# Patient Record
Sex: Male | Born: 1995 | Race: White | Hispanic: No | Marital: Single | Smoking: Current every day smoker
Health system: Southern US, Community
[De-identification: ages and names within clinical notes are randomized; demographics above are authoritative.]

## PROBLEM LIST (undated history)

## (undated) DIAGNOSIS — S40819A Abrasion of unspecified upper arm, initial encounter: Secondary | ICD-10-CM

## (undated) DIAGNOSIS — Z8719 Personal history of other diseases of the digestive system: Secondary | ICD-10-CM

## (undated) DIAGNOSIS — R05 Cough: Secondary | ICD-10-CM

## (undated) DIAGNOSIS — Q793 Gastroschisis: Secondary | ICD-10-CM

## (undated) DIAGNOSIS — Z8619 Personal history of other infectious and parasitic diseases: Secondary | ICD-10-CM

## (undated) DIAGNOSIS — S91329A Laceration with foreign body, unspecified foot, initial encounter: Secondary | ICD-10-CM

## (undated) HISTORY — PX: GASTROSCHISIS CLOSURE: SHX1700

## (undated) HISTORY — PX: INGUINAL HERNIA REPAIR: SHX194

## (undated) HISTORY — PX: ABDOMINAL SURGERY: SHX537

---
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
## 2015-05-01 ENCOUNTER — Emergency Department (HOSPITAL_COMMUNITY)
Admission: EM | Admit: 2015-05-01 | Discharge: 2015-05-01 | Disposition: A | Discharge status: Home / Self Care | Payer: Medicaid Other | Attending: Emergency Medicine | Admitting: Emergency Medicine

## 2015-05-01 ENCOUNTER — Encounter (HOSPITAL_COMMUNITY): Payer: Self-pay | Admitting: *Deleted

## 2015-05-01 ENCOUNTER — Emergency Department (HOSPITAL_COMMUNITY): Payer: Medicaid Other

## 2015-05-01 DIAGNOSIS — F17200 Nicotine dependence, unspecified, uncomplicated: Secondary | ICD-10-CM | POA: Diagnosis not present

## 2015-05-01 DIAGNOSIS — M25461 Effusion, right knee: Secondary | ICD-10-CM | POA: Insufficient documentation

## 2015-05-01 DIAGNOSIS — S59901A Unspecified injury of right elbow, initial encounter: Secondary | ICD-10-CM | POA: Insufficient documentation

## 2015-05-01 DIAGNOSIS — W19XXXA Unspecified fall, initial encounter: Secondary | ICD-10-CM

## 2015-05-01 DIAGNOSIS — W1839XA Other fall on same level, initial encounter: Secondary | ICD-10-CM | POA: Insufficient documentation

## 2015-05-01 DIAGNOSIS — Z8619 Personal history of other infectious and parasitic diseases: Secondary | ICD-10-CM | POA: Diagnosis not present

## 2015-05-01 DIAGNOSIS — Z87738 Personal history of other specified (corrected) congenital malformations of digestive system: Secondary | ICD-10-CM | POA: Diagnosis not present

## 2015-05-01 DIAGNOSIS — Y9289 Other specified places as the place of occurrence of the external cause: Secondary | ICD-10-CM | POA: Diagnosis not present

## 2015-05-01 DIAGNOSIS — S8991XA Unspecified injury of right lower leg, initial encounter: Secondary | ICD-10-CM | POA: Insufficient documentation

## 2015-05-01 DIAGNOSIS — Z23 Encounter for immunization: Secondary | ICD-10-CM | POA: Diagnosis not present

## 2015-05-01 DIAGNOSIS — Y998 Other external cause status: Secondary | ICD-10-CM | POA: Insufficient documentation

## 2015-05-01 DIAGNOSIS — Z8719 Personal history of other diseases of the digestive system: Secondary | ICD-10-CM | POA: Diagnosis not present

## 2015-05-01 DIAGNOSIS — S30811A Abrasion of abdominal wall, initial encounter: Secondary | ICD-10-CM | POA: Diagnosis not present

## 2015-05-01 DIAGNOSIS — Y9301 Activity, walking, marching and hiking: Secondary | ICD-10-CM | POA: Diagnosis not present

## 2015-05-01 DIAGNOSIS — Z79899 Other long term (current) drug therapy: Secondary | ICD-10-CM | POA: Diagnosis not present

## 2015-05-01 DIAGNOSIS — Z791 Long term (current) use of non-steroidal anti-inflammatories (NSAID): Secondary | ICD-10-CM | POA: Insufficient documentation

## 2015-05-01 DIAGNOSIS — R52 Pain, unspecified: Secondary | ICD-10-CM

## 2015-05-01 DIAGNOSIS — Z792 Long term (current) use of antibiotics: Secondary | ICD-10-CM | POA: Insufficient documentation

## 2015-05-01 MED ORDER — MELOXICAM 7.5 MG PO TABS: 7.5000 mg | ORAL_TABLET | Freq: Every day | ORAL | Status: AC | PRN

## 2015-05-01 MED ORDER — TETANUS-DIPHTH-ACELL PERTUSSIS 5-2.5-18.5 LF-MCG/0.5 IM SUSP
0.5000 mL | Freq: Once | INTRAMUSCULAR | Status: AC
  Administered 2015-05-01: 0.5 mL via INTRAMUSCULAR
  Filled 2015-05-01: qty 0.5

## 2015-05-01 NOTE — Discharge Instructions (Signed)
Read the information below.  Use the prescribed medication as directed.  Please discuss all new medications with your pharmacist.  You may return to the Emergency Department at any time for worsening condition or any new symptoms that concern you.  If you develop uncontrolled pain, weakness or numbness of the extremity, severe discoloration of the skin, or you are unable to walk, return to the ER for a recheck.   If you develop redness, swelling, pus draining from the wound, or fevers greater than 100.4, return to the ER immediately for a recheck.     Abrasion An abrasion is a cut or scrape on the outer surface of your skin. An abrasion does not extend through all of the layers of your skin. It is important to care for your abrasion properly to prevent infection. CAUSES Most abrasions are caused by falling on or gliding across the ground or another surface. When your skin rubs on something, the outer and inner layer of skin rubs off.  SYMPTOMS A cut or scrape is the main symptom of this condition. The scrape may be bleeding, or it may appear red or pink. If there was an associated fall, there may be an underlying bruise. DIAGNOSIS An abrasion is diagnosed with a physical exam. TREATMENT Treatment for this condition depends on how large and deep the abrasion is. Usually, your abrasion will be cleaned with water and mild soap. This removes any dirt or debris that may be stuck. An antibiotic ointment may be applied to the abrasion to help prevent infection. A bandage (dressing) may be placed on the abrasion to keep it clean. You may also need a tetanus shot. HOME CARE INSTRUCTIONS Medicines  Take or apply medicines only as directed by your health care provider.  If you were prescribed an antibiotic ointment, finish all of it even if you start to feel better. Wound Care  Clean the wound with mild soap and water 2-3 times per day or as directed by your health care provider. Pat your wound dry with a  clean towel. Do not rub it.  There are many different ways to close and cover a wound. Follow instructions from your health care provider about:  Wound care.  Dressing changes and removal.  Check your wound every day for signs of infection. Watch for:  Redness, swelling, or pain.  Fluid, blood, or pus. General Instructions  Keep the dressing dry as directed by your health care provider. Do not take baths, swim, use a hot tub, or do anything that would put your wound underwater until your health care provider approves.  If there is swelling, raise (elevate) the injured area above the level of your heart while you are sitting or lying down.  Keep all follow-up visits as directed by your health care provider. This is important. SEEK MEDICAL CARE IF:  You received a tetanus shot and you have swelling, severe pain, redness, or bleeding at the injection site.  Your pain is not controlled with medicine.  You have increased redness, swelling, or pain at the site of your wound. SEEK IMMEDIATE MEDICAL CARE IF:  You have a red streak going away from your wound.  You have a fever.  You have fluid, blood, or pus coming from your wound.  You notice a bad smell coming from your wound or your dressing.   This information is not intended to replace advice given to you by your health care provider. Make sure you discuss any questions you have with  your health care provider.   Document Released: 03/07/2005 Document Revised: 02/16/2015 Document Reviewed: 05/26/2014 Elsevier Interactive Patient Education 2016 [employer]  Knee Effusion Knee effusion means that you have extra fluid in your knee. This can cause pain. Your knee may be more difficult to bend and move. HOME CARE  Use crutches as told by your doctor.  Wear a knee brace as told by your doctor.  Apply ice to the swollen area:  Put ice in a plastic bag.  Place a towel between your skin and the bag.  Leave the ice on for  20 minutes, 2-3 times per day.  Keep your knee raised (elevated) when you are sitting or lying down.  Take medicines only as told by your doctor.  Do any rehabilitation or strengthening exercises as told by your doctor.  Rest your knee as told by your doctor. You may start doing your normal activities again when your doctor says it is okay.  Keep all follow-up visits as told by your doctor. This is important. GET HELP IF:   You continue to have pain in your knee. GET HELP RIGHT AWAY IF:  You have increased swelling or redness of your knee.  You have severe pain in your knee.  You have a fever.   This information is not intended to replace advice given to you by your health care provider. Make sure you discuss any questions you have with your health care provider.   Document Released: 06/30/2010 Document Revised: 06/18/2014 Document Reviewed: 01/11/2014 Elsevier Interactive Patient Education [employer]

## 2015-05-01 NOTE — ED Provider Notes (Signed)
CSN: [id number]     Arrival date & time 05/01/15  1434 History  By signing my name below, I, Elon Spanner, attest that this documentation has been prepared under the direction and in the presence of [employer], PA-C. Electronically Signed: Elon Spanner ED Scribe. 05/01/2015. 3:55 PM.    Chief Complaint  Patient presents with  . Knee Pain    right  . Flank Pain    right   The history is provided by the patient. No language interpreter was used.   HPI Comments: Devon Sosa is a 19 y.o. male who presents to the Emergency Department complaining of right knee pain (worse with walking, flexion), left flank pain/abrasions, and right elbow pain onset two days ago.  The patient reports he was walking through an old house and fell through the floor.  He was able to catch himself from falling all the way through with his elbow but his right knee moved medially and he struck his left flank on the boards.  The knee has made a popping sensation intermittently since onset.  He has taken Tylenol but tried no other treatments.  He denies extremity weakness/numbness, blood in stool, hematuria, n/v/d, dysuria.    Past Medical History  Diagnosis Date  . Congenital gastroschisis   . Laceration of foot with foreign body 08/20/2012    left  . History of shingles as a child  . Cough 08/25/2012  . History of fecal impaction     occasionally  . Arm abrasion 08/25/2012    bilateral   Past Surgical History  Procedure Laterality Date  . Abdominal surgery      multiple surgeries after initial gastroschisis closure  . Inguinal hernia repair    . Gastroschisis closure  as a newborn   Family History  Problem Relation Age of Onset  . Adopted: Yes  . Hypertension Mother   . Hypertension Father   . Heart disease Paternal Grandfather     MI   Social History  Substance Use Topics  . Smoking status: Current Every Day Smoker  . Smokeless tobacco: Never Used  . Alcohol Use: No    Review of Systems   Constitutional: Negative for fever.  Cardiovascular: Negative for chest pain.  Gastrointestinal: Negative for vomiting and abdominal pain.  Musculoskeletal: Positive for arthralgias.  Skin: Positive for wound.  Allergic/Immunologic: Negative for immunocompromised state.  Neurological: Negative for weakness and numbness.  Hematological: Does not bruise/bleed easily.  Psychiatric/Behavioral: Negative for self-injury.      Allergies  Epinephrine and Strawberry leaf  Home Medications   Prior to Admission medications   Medication Sig Start Date End Date Taking? Authorizing Provider  albuterol (PROVENTIL HFA;VENTOLIN HFA) 108 (90 BASE) MCG/ACT inhaler Inhale 2 puffs into the lungs every 6 (six) hours as needed for wheezing.    Historical Provider, MD  clindamycin (CLEOCIN) 300 MG capsule Take 1 capsule (300 mg total) by mouth 4 (four) times daily. X 7 days 08/21/12   Richardean Canal, MD  HYDROcodone-acetaminophen (NORCO/VICODIN) 5-325 MG per tablet Take 1 tablet by mouth every 6 (six) hours as needed for pain.    Historical Provider, MD  ibuprofen (ADVIL,MOTRIN) 800 MG tablet Take 1 tablet (800 mg total) by mouth 3 (three) times daily. 08/21/12   Richardean Canal, MD  omeprazole (PRILOSEC) 20 MG capsule Take 20 mg by mouth daily as needed.    Historical Provider, MD   BP 135/86 mmHg  Pulse 63  Temp(Src) 97.8 F (36.6 C) (  Oral)  Resp 18  SpO2 99% Physical Exam  Constitutional: He appears well-developed and well-nourished. No distress.  HENT:  Head: Normocephalic and atraumatic.  Neck: Neck supple.  Pulmonary/Chest: Effort normal.  Abdominal: Soft. He exhibits no distension. There is no tenderness. There is no rebound and no guarding.  No left CVA tenderness.  Musculoskeletal:  Spine nontender, no crepitus, or stepoffs. Lower extremities:  Strength 5/5, sensation intact, distal pulses intact.   Right knee with bony point tenderness medially. No laxity of joint.  Mild pain but able to  perform [facility].  No calf tenderness.  No erythema, edema, or warmth of joint.  Normal gait.  Neurological: He is alert.  Skin: He is not diaphoretic.  Large, healing abrasion to the left lateral abdominal wall.  No erythema, edema, warmth, discharge, or tenderness.   Nursing note and vitals reviewed.   ED Course  Procedures (including critical care time)  DIAGNOSTIC STUDIES: Oxygen Saturation is 99% on RA, normal by my interpretation.    COORDINATION OF CARE:  4:29 PM Discussed treatment plan with patient at bedside.  Patient acknowledges and agrees with plan.    Labs Review Labs Reviewed - No data to display  Imaging Review Dg Knee Complete 4 Views Right  05/01/2015  CLINICAL DATA:  Initial encounter for Patient states he was looking at a house to repair and the floor at the house gave through. Patient fell through the floor, catching himself on the boards of the floor. Patient c/o right medial knee pain. Denies any previous injuries to right knee. EXAM: RIGHT KNEE - COMPLETE 4+ VIEW COMPARISON:  None. FINDINGS: No acute fracture or dislocation. A small suprapatellar joint effusion. Joint spaces maintained. IMPRESSION: Small suprapatellar joint effusion. Electronically Signed   By: Jeronimo GreavesKyle  Talbot M.D.   On: 05/01/2015 17:27      EKG Interpretation None      MDM   Final diagnoses:  Pain  Fall  Right knee injury, initial encounter  Joint effusion, knee, right  Abrasion of flank, initial encounter    Afebrile, nontoxic patient with injury to his right knee and left side while accidentally falling through a rotten floor.  Tdap updated.   Xray shows small right joint effusion.  Possible MCL vs meniscal injury.  No bony injury.  Left flank with skin abrasion, no e/o infection.  No abdominal or CVA pain.  Doubt internal injury.   D/C home with mobic, knee sleeve, orthopedic follow up.  Discussed result, findings, treatment, and follow up  with patient.  Pt given return  precautions.  Pt verbalizes understanding and agrees with plan.        I personally performed the services described in this documentation, which was scribed in my presence. The recorded information has been reviewed and is accurate.    Trixie Dredgemily Yuvan Medinger, PA-C 05/01/15 1840  Tilden FossaElizabeth Rees, MD 05/01/15 2128

## 2015-05-01 NOTE — ED Notes (Signed)
Patient states he was looking at a house to repair and the floor the house gave through.  Patient fell through the floor, catching himself on the boards of the floor.  Patient c/o right knee pain - especially with ambulation and has some abrasions to left flank where he fell through floor.  Patient is uncertain as to whether tetanus is up to date.  Patient denies hitting head or LOC.
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
